# Patient Record
Sex: Female | Born: 1965
Health system: Southern US, Community
[De-identification: ages and names within clinical notes are randomized; demographics above are authoritative.]

## PROBLEM LIST (undated history)

## (undated) DIAGNOSIS — E119 Type 2 diabetes mellitus without complications: Secondary | ICD-10-CM

---

## 1998-02-27 ENCOUNTER — Emergency Department (HOSPITAL_COMMUNITY): Admission: EM | Admit: 1998-02-27 | Discharge: 1998-02-27 | Payer: Self-pay | Admitting: Emergency Medicine

## 1998-10-31 ENCOUNTER — Other Ambulatory Visit: Admission: RE | Admit: 1998-10-31 | Discharge: 1998-10-31 | Payer: Self-pay | Admitting: Obstetrics

## 1999-12-25 ENCOUNTER — Other Ambulatory Visit: Admission: RE | Admit: 1999-12-25 | Discharge: 1999-12-25 | Payer: Self-pay | Admitting: Obstetrics and Gynecology

## 2002-11-02 ENCOUNTER — Other Ambulatory Visit: Admission: RE | Admit: 2002-11-02 | Discharge: 2002-11-02 | Payer: Self-pay | Admitting: Obstetrics and Gynecology

## 2004-03-26 ENCOUNTER — Other Ambulatory Visit: Admission: RE | Admit: 2004-03-26 | Discharge: 2004-03-26 | Payer: Self-pay | Admitting: Obstetrics and Gynecology

## 2005-03-31 ENCOUNTER — Other Ambulatory Visit: Admission: RE | Admit: 2005-03-31 | Discharge: 2005-03-31 | Payer: Self-pay | Admitting: Obstetrics and Gynecology

## 2005-07-21 ENCOUNTER — Ambulatory Visit (HOSPITAL_COMMUNITY): Admission: RE | Admit: 2005-07-21 | Discharge: 2005-07-21 | Payer: Self-pay | Admitting: Obstetrics and Gynecology

## 2005-08-13 ENCOUNTER — Encounter: Admission: RE | Admit: 2005-08-13 | Discharge: 2005-08-13 | Payer: Self-pay | Admitting: Obstetrics and Gynecology

## 2005-09-11 ENCOUNTER — Ambulatory Visit (HOSPITAL_COMMUNITY): Admission: RE | Admit: 2005-09-11 | Discharge: 2005-09-11 | Payer: Self-pay | Admitting: Obstetrics and Gynecology

## 2005-09-15 ENCOUNTER — Ambulatory Visit (HOSPITAL_COMMUNITY): Admission: RE | Admit: 2005-09-15 | Discharge: 2005-09-15 | Payer: Self-pay | Admitting: Obstetrics and Gynecology

## 2009-07-19 ENCOUNTER — Encounter (INDEPENDENT_AMBULATORY_CARE_PROVIDER_SITE_OTHER): Payer: Self-pay | Admitting: *Deleted

## 2009-07-19 ENCOUNTER — Encounter: Admission: RE | Admit: 2009-07-19 | Discharge: 2009-07-19 | Payer: Self-pay | Admitting: Obstetrics and Gynecology

## 2010-09-22 ENCOUNTER — Encounter: Payer: Self-pay | Admitting: Obstetrics and Gynecology

## 2010-10-01 NOTE — Letter (Signed)
Summary: [facility] Cancel and No Reschedule  [facility]  [address] [place], [place] [address]   Phone: [phone]  Fax: [phone]      July 19, 2009 MRN: [id number]   [facility] [address] [place], [place]  [address]     You recently cancelled your endoscopic procedure at the [facility] and did not reschedule for another date.    Your provider recommended this procedure for the benefit of your health.  It is very important that you reschedule it.  Failure to do so may be to the detriment of your health.  Please call us at [phone] and we will be happy to assist you with rescheduling.    If you were referred for this procedure by another physician/provider, we will notify him/her that you did not keep your appointment.   Sincerely,   [facility]

## 2013-06-19 ENCOUNTER — Emergency Department (INDEPENDENT_AMBULATORY_CARE_PROVIDER_SITE_OTHER)
Admission: EM | Admit: 2013-06-19 | Discharge: 2013-06-19 | Disposition: A | Discharge status: Home / Self Care | Payer: Self-pay | Source: Home / Self Care | Attending: Emergency Medicine | Admitting: Emergency Medicine

## 2013-06-19 ENCOUNTER — Encounter (HOSPITAL_COMMUNITY): Payer: Self-pay | Admitting: Emergency Medicine

## 2013-06-19 DIAGNOSIS — N3000 Acute cystitis without hematuria: Secondary | ICD-10-CM

## 2013-06-19 HISTORY — DX: Type 2 diabetes mellitus without complications: E11.9

## 2013-06-19 LAB — POCT URINALYSIS DIP (DEVICE)
Bilirubin Urine: NEGATIVE
Glucose, UA: NEGATIVE mg/dL
Ketones, ur: NEGATIVE mg/dL
Nitrite: POSITIVE — AB
Protein, ur: NEGATIVE mg/dL
Specific Gravity, Urine: 1.01 (ref 1.005–1.030)
Urobilinogen, UA: 0.2 mg/dL (ref 0.0–1.0)
pH: 6 (ref 5.0–8.0)

## 2013-06-19 LAB — POCT PREGNANCY, URINE: Preg Test, Ur: NEGATIVE

## 2013-06-19 MED ORDER — CEPHALEXIN 500 MG PO CAPS: 500.0000 mg | ORAL_CAPSULE | Freq: Three times a day (TID) | ORAL | Status: DC

## 2013-06-19 NOTE — ED Notes (Signed)
C/O foul odor to urine and cloudy urine x 4 days without any dysuria or pain.

## 2013-06-19 NOTE — ED Provider Notes (Signed)
Chief Complaint:   Chief Complaint  Patient presents with  . Urinary Tract Infection    History of Present Illness:   Kelly Miranda is a 47 year old female who has had a five-day history of cloudy, malodorous urine. She denies any dysuria, frequency, urgency, or hematuria. She has no lower, or lower back pain. She denies fever, chills, nausea, or vomiting. She has had no GYN complaints. She had a urinary tract infection the past but this was years ago.  Review of Systems:  Other than noted above, the patient denies any of the following symptoms: General:  No fevers, chills, sweats, aches, or fatigue. GI:  No abdominal pain, back pain, nausea, vomiting, diarrhea, or constipation. GU:  No dysuria, frequency, urgency, hematuria, or incontinence. GYN:  No discharge, itching, vulvar pain or lesions, pelvic pain, or abnormal vaginal bleeding.  PMFSH:  Past medical history, family history, social history, meds, and allergies were reviewed.  She is a diabetic and takes metformin and lisinopril.  Physical Exam:   Vital signs:  BP 118/80  Pulse 76  Temp(Src) 98 F (36.7 C) (Oral)  Resp 16  SpO2 99%  LMP 06/05/2013 Gen:  Alert, oriented, in no distress. Lungs:  Clear to auscultation, no wheezes, rales or rhonchi. Heart:  Regular rhythm, no gallop or murmer. Abdomen:  Flat and soft. There was slight suprapubic pain to palpation.  No guarding, or rebound.  No hepato-splenomegaly or mass.  Bowel sounds were normally active.  No hernia. Back:  No CVA tenderness.  Skin:  Clear, warm and dry.  Labs:    Results for orders placed during the hospital encounter of 06/19/13  POCT URINALYSIS DIP (DEVICE)      Result Value Range   Glucose, UA NEGATIVE  NEGATIVE mg/dL   Bilirubin Urine NEGATIVE  NEGATIVE   Ketones, ur NEGATIVE  NEGATIVE mg/dL   Specific Gravity, Urine 1.010  1.005 - 1.030   Hgb urine dipstick MODERATE (*) NEGATIVE   pH 6.0  5.0 - 8.0   Protein, ur NEGATIVE  NEGATIVE mg/dL   Urobilinogen, UA 0.2  0.0 - 1.0 mg/dL   Nitrite POSITIVE (*) NEGATIVE   Leukocytes, UA TRACE (*) NEGATIVE  POCT PREGNANCY, URINE      Result Value Range   Preg Test, Ur NEGATIVE  NEGATIVE     A urine culture was obtained.  Results are pending at this time and we will call about any positive results.  Assessment: The encounter diagnosis was Acute cystitis.   No evidence of pyelonephritis.  Plan:   1.  Meds:  The following meds were prescribed:   Discharge Medication List as of 06/19/2013 12:37 PM    START taking these medications   Details  cephALEXin (KEFLEX) 500 MG capsule Take 1 capsule (500 mg total) by mouth 3 (three) times daily., Starting 06/19/2013, Until Discontinued, Normal        2.  Patient Education/Counseling:  The patient was given appropriate handouts, self care instructions, and instructed in symptomatic relief. The patient was told to avoid intercourse for 10 days, get extra fluids, and return for a follow up with her primary care doctor at the completion of treatment for a repeat UA and culture.    3.  Follow up:  The patient was told to follow up if no better in 3 to 4 days, if becoming worse in any way, and given some red flag symptoms such as fever, vomiting, or worsening pain which would prompt immediate return.  Follow up here  as necessary.     Reuben Likes, MD 06/19/13 [phone]

## 2013-06-21 LAB — URINE CULTURE
Colony Count: >=100000
Special Requests: NORMAL

## 2013-06-22 NOTE — ED Notes (Signed)
Urine culture: >100,000 colonies E. Coli.  Pt. adequately treated with Keflex. Kelly Miranda 06/22/2013

## 2014-02-24 ENCOUNTER — Emergency Department (HOSPITAL_COMMUNITY)
Admission: EM | Admit: 2014-02-24 | Discharge: 2014-02-24 | Disposition: A | Discharge status: Home / Self Care | Payer: 59 | Attending: Emergency Medicine | Admitting: Emergency Medicine

## 2014-02-24 ENCOUNTER — Encounter (HOSPITAL_COMMUNITY): Payer: Self-pay | Admitting: Emergency Medicine

## 2014-02-24 DIAGNOSIS — Z792 Long term (current) use of antibiotics: Secondary | ICD-10-CM | POA: Insufficient documentation

## 2014-02-24 DIAGNOSIS — Z79899 Other long term (current) drug therapy: Secondary | ICD-10-CM | POA: Insufficient documentation

## 2014-02-24 DIAGNOSIS — L731 Pseudofolliculitis barbae: Secondary | ICD-10-CM

## 2014-02-24 DIAGNOSIS — Z3202 Encounter for pregnancy test, result negative: Secondary | ICD-10-CM | POA: Insufficient documentation

## 2014-02-24 DIAGNOSIS — T887XXA Unspecified adverse effect of drug or medicament, initial encounter: Secondary | ICD-10-CM

## 2014-02-24 DIAGNOSIS — T370X5A Adverse effect of sulfonamides, initial encounter: Secondary | ICD-10-CM | POA: Insufficient documentation

## 2014-02-24 DIAGNOSIS — L738 Other specified follicular disorders: Secondary | ICD-10-CM | POA: Insufficient documentation

## 2014-02-24 DIAGNOSIS — E119 Type 2 diabetes mellitus without complications: Secondary | ICD-10-CM | POA: Insufficient documentation

## 2014-02-24 DIAGNOSIS — R110 Nausea: Secondary | ICD-10-CM | POA: Insufficient documentation

## 2014-02-24 LAB — POC URINE PREG, ED: Preg Test, Ur: NEGATIVE

## 2014-02-24 MED ORDER — DIPHENHYDRAMINE HCL 25 MG PO CAPS
50.0000 mg | ORAL_CAPSULE | Freq: Once | ORAL | Status: AC
  Administered 2014-02-24: 50 mg via ORAL
  Filled 2014-02-24: qty 2
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
## 2014-02-24 NOTE — Discharge Instructions (Signed)
Continue to use Benadryl for any skin itching.  Use warm compresses over your ingrown hairs for 20-30 minutes to help with redness and swelling.  Follow up with a primary care provider for continued evaluation and treatment.    Ingrown Hair An ingrown hair is a hair that curls and re-enters the skin instead of growing straight out of the skin. It happens most often with curly hair. It is usually more severe in the neck area, but it can occur in any shaved area, including the beard area, groin, scalp, and legs. An ingrown hair may cause small pockets of infection. CAUSES  Shaving closely, tweezing, or waxing, especially curly hair. Using hair removal creams can sometimes lead to ingrown hairs, especially in the groin. SYMPTOMS   Small bumps on the skin. The bumps may be filled with pus.  Pain.  Itching. DIAGNOSIS  Your caregiver can usually tell what is wrong by doing a physical exam. TREATMENT  If there is a severe infection, your caregiver may prescribe antibiotic medicines. Laser hair removal may also be done to help prevent regrowth of the hair. HOME CARE INSTRUCTIONS   Do not shave irritated skin. You may start shaving again once the irritation has gone away.  If you are prone to ingrown hairs, consider not shaving as much as possible.  If antibiotics are prescribed, take them as directed. Finish them even if you start to feel better.  You may use a facial sponge in a gentle circular motion to help dislodge ingrown hairs on the face.  You may use a hair removal cream weekly, especially on the legs and underarms. Stop using the cream if it irritates your skin. Use caution when using hair removal creams in the groin area. SHAVING INSTRUCTIONS AFTER TREATMENT  Shower before shaving. Keep areas to be shaved packed in warm, moist wraps for several minutes before shaving. The warm, moist environment helps soften the hairs and makes ingrown hairs less likely to occur.  Use thick  shaving gels.  Use a bump fighter razor that cuts hair slightly above the skin level or use an electric shaver with a longer shave setting.  Shave in the direction of hair growth. Avoid making multiple razor strokes.  Use moisturizing lotions after shaving. Document Released: 11/24/2000 Document Revised: 02/17/2012 Document Reviewed: 11/18/2011 [facility] Patient Information 2015 [place], [place]. This information is not intended to replace advice given to you by your health care provider. Make sure you discuss any questions you have with your health care provider.

## 2014-02-24 NOTE — ED Provider Notes (Signed)
CSN: [id number]     Arrival date & time 02/24/14  0119 History   First MD Initiated Contact with Patient 02/24/14 0358     Chief Complaint  Patient presents with  . Abscess    R thigh  . Nausea   HPI  History provided by the patient. Patient is a 48 year old female with history of diabetes presenting with symptoms of itching to the skin as well as concerns for redness to the right side. The patient reports having small little red bumps to her thigh. She reports squeezing messing with one of the areas to the right side 2 days ago. She has been using over-the-counter topical creams and ointments of varying types over the area. This did result in some increased redness around the area as well as some burning sensation. This has resolved. She does continue to have some general itching of the skin diffusely. She was worried about a bad reaction to Septra which she recently started taking one week ago for an abscess infection with an I&D. Does report this area has improved significantly. No other associated symptoms. No fever, chills or sweats.    Past Medical History  Diagnosis Date  . Diabetes mellitus without complication    History reviewed. No pertinent past surgical history. No family history on file. History  Substance Use Topics  . Smoking status: Never Smoker   . Smokeless tobacco: Not on file  . Alcohol Use: No   OB History   Grav Para Term Preterm Abortions TAB SAB Ect Mult Living                 Review of Systems  Constitutional: Negative for fever, chills and diaphoresis.  All other systems reviewed and are negative.     Allergies  Review of patient's allergies indicates no known allergies.  Home Medications   Prior to Admission medications   Medication Sig Start Date End Date Taking? Authorizing Enora Trillo  acetaminophen (TYLENOL) 500 MG tablet Take 500 mg by mouth every 6 (six) hours as needed for mild pain, fever or headache.    Yes Historical Cranston Koors, MD   lisinopril (PRINIVIL,ZESTRIL) 20 MG tablet Take 20 mg by mouth daily.   Yes Historical Shyler Holzman, MD  metFORMIN (GLUCOPHAGE) 500 MG tablet Take 500 mg by mouth daily.   Yes Historical Tannen Vandezande, MD  sulfamethoxazole-trimethoprim (BACTRIM DS) 800-160 MG per tablet Take 1 tablet by mouth 2 (two) times daily.   Yes Historical Lukisha Procida, MD   BP 93/66  Pulse 95  Temp(Src) 99.1 F (37.3 C) (Oral)  Resp 18  Ht 5\' 10"  (1.778 m)  Wt 290 lb (131.543 kg)  BMI 41.61 kg/m2  SpO2 97%  LMP 01/01/2014 Physical Exam  Nursing note and vitals reviewed. Constitutional: She is oriented to person, place, and time. She appears well-developed and well-nourished. No distress.  HENT:  Head: Normocephalic.  Cardiovascular: Normal rate and regular rhythm.   Pulmonary/Chest: Effort normal and breath sounds normal. No respiratory distress. She has no wheezes. She has no rales.  Abdominal: Soft.  Neurological: She is alert and oriented to person, place, and time.  Skin: Skin is warm and dry. No rash noted.  Patient with a single papular erythematous lesion to the proximal anterior left thigh over the hair follicle. Mild tenderness. No obvious pustule. No surrounding erythema or induration.  Similar type lesion to the medial anterior right thigh with 1 cm circular surrounding erythema. No induration. No bleeding or drainage.  Skin otherwise unremarkable. No other rash.  Psychiatric: She has a normal mood and affect. Her behavior is normal.    ED Course  Procedures  COORDINATION OF CARE:  Nursing notes reviewed. Vital signs reviewed. Initial pt interview and examination performed.   Filed Vitals:   02/24/14 0137  BP: 93/66  Pulse: 95  Temp: 99.1 F (37.3 C)  TempSrc: Oral  Resp: 18  Height: 5\' 10"  (1.778 m)  Weight: 290 lb (131.543 kg)  SpO2: 97%    4:25AM-patient seen and I reviewed. She appears well no acute distress. His appears regular toxic. She is very small ingrown hairs to the upper thighs  bilaterally. There is some increased redness and irritation around one ingrown hair to the medial part of the right side. No significant induration. No signs of large abscess. No significant pain. Patient given Benadryl for her itching symptoms. She never discharged with instructions for warm compresses over the area.   Results for orders placed during the hospital encounter of 02/24/14  POC URINE PREG, ED      Result Value Ref Range   Preg Test, Ur NEGATIVE  NEGATIVE     MDM   Final diagnoses:  Ingrown hair  Non-dose-related adverse reaction to medication, initial encounter     Angus Sellereter S Dammen, PA-C 02/24/14 0533

## 2014-02-26 NOTE — ED Provider Notes (Signed)
Medical screening examination/treatment/procedure(s) were performed by non-physician practitioner and as supervising physician I was immediately available for consultation/collaboration.   Candyce ChurnJohn David Wofford III, MD 02/26/14 [phone]

## 2014-03-23 ENCOUNTER — Other Ambulatory Visit: Payer: Self-pay | Admitting: Physician Assistant

## 2014-03-23 ENCOUNTER — Other Ambulatory Visit (HOSPITAL_COMMUNITY)
Admission: RE | Admit: 2014-03-23 | Discharge: 2014-03-23 | Disposition: A | Discharge status: Home / Self Care | Payer: 59 | Source: Ambulatory Visit | Attending: Family Medicine | Admitting: Family Medicine

## 2014-03-23 DIAGNOSIS — Z124 Encounter for screening for malignant neoplasm of cervix: Secondary | ICD-10-CM | POA: Insufficient documentation

## 2014-03-24 LAB — CYTOLOGY - PAP

## 2015-03-26 ENCOUNTER — Other Ambulatory Visit: Payer: Self-pay | Admitting: Obstetrics and Gynecology

## 2015-03-27 LAB — CYTOLOGY - PAP

## 2015-12-15 DIAGNOSIS — R635 Abnormal weight gain: Secondary | ICD-10-CM | POA: Diagnosis not present

## 2015-12-15 DIAGNOSIS — R5383 Other fatigue: Secondary | ICD-10-CM | POA: Diagnosis not present

## 2015-12-15 DIAGNOSIS — Z79899 Other long term (current) drug therapy: Secondary | ICD-10-CM | POA: Diagnosis not present

## 2015-12-15 DIAGNOSIS — R0602 Shortness of breath: Secondary | ICD-10-CM | POA: Diagnosis not present

## 2015-12-15 DIAGNOSIS — E559 Vitamin D deficiency, unspecified: Secondary | ICD-10-CM | POA: Diagnosis not present

## 2015-12-24 DIAGNOSIS — M170 Bilateral primary osteoarthritis of knee: Secondary | ICD-10-CM | POA: Diagnosis not present

## 2016-01-23 DIAGNOSIS — R635 Abnormal weight gain: Secondary | ICD-10-CM | POA: Diagnosis not present

## 2016-01-23 DIAGNOSIS — Z79899 Other long term (current) drug therapy: Secondary | ICD-10-CM | POA: Diagnosis not present

## 2016-01-23 DIAGNOSIS — E782 Mixed hyperlipidemia: Secondary | ICD-10-CM | POA: Diagnosis not present

## 2016-01-23 DIAGNOSIS — E119 Type 2 diabetes mellitus without complications: Secondary | ICD-10-CM | POA: Diagnosis not present

## 2016-02-15 DIAGNOSIS — E1165 Type 2 diabetes mellitus with hyperglycemia: Secondary | ICD-10-CM | POA: Diagnosis not present

## 2016-03-03 DIAGNOSIS — R635 Abnormal weight gain: Secondary | ICD-10-CM | POA: Diagnosis not present

## 2016-03-03 DIAGNOSIS — Z79899 Other long term (current) drug therapy: Secondary | ICD-10-CM | POA: Diagnosis not present

## 2016-05-31 DIAGNOSIS — R739 Hyperglycemia, unspecified: Secondary | ICD-10-CM | POA: Diagnosis not present

## 2016-05-31 DIAGNOSIS — R635 Abnormal weight gain: Secondary | ICD-10-CM | POA: Diagnosis not present

## 2016-05-31 DIAGNOSIS — E782 Mixed hyperlipidemia: Secondary | ICD-10-CM | POA: Diagnosis not present

## 2016-05-31 DIAGNOSIS — R5383 Other fatigue: Secondary | ICD-10-CM | POA: Diagnosis not present

## 2016-05-31 DIAGNOSIS — E559 Vitamin D deficiency, unspecified: Secondary | ICD-10-CM | POA: Diagnosis not present

## 2016-05-31 DIAGNOSIS — Z79899 Other long term (current) drug therapy: Secondary | ICD-10-CM | POA: Diagnosis not present

## 2016-07-04 DIAGNOSIS — Z713 Dietary counseling and surveillance: Secondary | ICD-10-CM | POA: Diagnosis not present

## 2016-07-07 DIAGNOSIS — Z01419 Encounter for gynecological examination (general) (routine) without abnormal findings: Secondary | ICD-10-CM | POA: Diagnosis not present

## 2016-07-07 DIAGNOSIS — Z1231 Encounter for screening mammogram for malignant neoplasm of breast: Secondary | ICD-10-CM | POA: Diagnosis not present

## 2016-07-07 DIAGNOSIS — Z6841 Body Mass Index (BMI) 40.0 and over, adult: Secondary | ICD-10-CM | POA: Diagnosis not present

## 2016-07-07 DIAGNOSIS — N912 Amenorrhea, unspecified: Secondary | ICD-10-CM | POA: Diagnosis not present

## 2016-07-17 DIAGNOSIS — Z79899 Other long term (current) drug therapy: Secondary | ICD-10-CM | POA: Diagnosis not present

## 2016-07-17 DIAGNOSIS — E119 Type 2 diabetes mellitus without complications: Secondary | ICD-10-CM | POA: Diagnosis not present

## 2016-07-17 DIAGNOSIS — E782 Mixed hyperlipidemia: Secondary | ICD-10-CM | POA: Diagnosis not present

## 2016-07-17 DIAGNOSIS — R635 Abnormal weight gain: Secondary | ICD-10-CM | POA: Diagnosis not present

## 2016-08-09 ENCOUNTER — Emergency Department (HOSPITAL_COMMUNITY): Payer: BLUE CROSS/BLUE SHIELD

## 2016-08-09 ENCOUNTER — Emergency Department (HOSPITAL_COMMUNITY)
Admission: EM | Admit: 2016-08-09 | Discharge: 2016-08-09 | Disposition: A | Discharge status: Home / Self Care | Payer: BLUE CROSS/BLUE SHIELD | Attending: Emergency Medicine | Admitting: Emergency Medicine

## 2016-08-09 ENCOUNTER — Encounter (HOSPITAL_COMMUNITY): Payer: Self-pay

## 2016-08-09 DIAGNOSIS — M25562 Pain in left knee: Secondary | ICD-10-CM | POA: Diagnosis not present

## 2016-08-09 DIAGNOSIS — Y999 Unspecified external cause status: Secondary | ICD-10-CM | POA: Insufficient documentation

## 2016-08-09 DIAGNOSIS — Y92410 Unspecified street and highway as the place of occurrence of the external cause: Secondary | ICD-10-CM | POA: Insufficient documentation

## 2016-08-09 DIAGNOSIS — Z7984 Long term (current) use of oral hypoglycemic drugs: Secondary | ICD-10-CM | POA: Insufficient documentation

## 2016-08-09 DIAGNOSIS — M542 Cervicalgia: Secondary | ICD-10-CM | POA: Diagnosis not present

## 2016-08-09 DIAGNOSIS — S161XXA Strain of muscle, fascia and tendon at neck level, initial encounter: Secondary | ICD-10-CM | POA: Diagnosis not present

## 2016-08-09 DIAGNOSIS — S8992XA Unspecified injury of left lower leg, initial encounter: Secondary | ICD-10-CM | POA: Diagnosis not present

## 2016-08-09 DIAGNOSIS — E119 Type 2 diabetes mellitus without complications: Secondary | ICD-10-CM | POA: Insufficient documentation

## 2016-08-09 DIAGNOSIS — S199XXA Unspecified injury of neck, initial encounter: Secondary | ICD-10-CM | POA: Diagnosis present

## 2016-08-09 DIAGNOSIS — Y939 Activity, unspecified: Secondary | ICD-10-CM | POA: Insufficient documentation

## 2016-08-09 DIAGNOSIS — S8002XA Contusion of left knee, initial encounter: Secondary | ICD-10-CM

## 2016-08-09 MED ORDER — IBUPROFEN 600 MG PO TABS: 600.0000 mg | ORAL_TABLET | Freq: Four times a day (QID) | ORAL | 0 refills | Status: AC | PRN

## 2016-08-09 MED ORDER — HYDROCODONE-ACETAMINOPHEN 5-325 MG PO TABS: 2.0000 | ORAL_TABLET | ORAL | 0 refills | Status: AC | PRN

## 2016-08-09 MED ORDER — CYCLOBENZAPRINE HCL 10 MG PO TABS: 10.0000 mg | ORAL_TABLET | Freq: Two times a day (BID) | ORAL | 0 refills | Status: AC | PRN
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
## 2016-10-15 ENCOUNTER — Other Ambulatory Visit: Payer: Self-pay | Admitting: Physician Assistant

## 2016-10-15 ENCOUNTER — Ambulatory Visit
Admission: RE | Admit: 2016-10-15 | Discharge: 2016-10-15 | Disposition: A | Discharge status: Home / Self Care | Payer: BLUE CROSS/BLUE SHIELD | Source: Ambulatory Visit | Attending: Physician Assistant | Admitting: Physician Assistant

## 2016-10-15 DIAGNOSIS — R202 Paresthesia of skin: Secondary | ICD-10-CM | POA: Diagnosis not present

## 2016-10-15 DIAGNOSIS — M79672 Pain in left foot: Secondary | ICD-10-CM | POA: Diagnosis not present

## 2016-10-27 DIAGNOSIS — E1165 Type 2 diabetes mellitus with hyperglycemia: Secondary | ICD-10-CM | POA: Diagnosis not present

## 2016-10-27 DIAGNOSIS — E7800 Pure hypercholesterolemia, unspecified: Secondary | ICD-10-CM | POA: Diagnosis not present

## 2016-10-27 DIAGNOSIS — Z0000 Encounter for general adult medical examination without abnormal findings: Secondary | ICD-10-CM | POA: Diagnosis not present

## 2016-10-28 ENCOUNTER — Other Ambulatory Visit: Payer: Self-pay | Admitting: Physician Assistant

## 2016-10-28 DIAGNOSIS — E1165 Type 2 diabetes mellitus with hyperglycemia: Secondary | ICD-10-CM

## 2016-10-31 DIAGNOSIS — M722 Plantar fascial fibromatosis: Secondary | ICD-10-CM | POA: Diagnosis not present

## 2016-10-31 DIAGNOSIS — G5601 Carpal tunnel syndrome, right upper limb: Secondary | ICD-10-CM | POA: Diagnosis not present

## 2016-12-06 DIAGNOSIS — R635 Abnormal weight gain: Secondary | ICD-10-CM | POA: Diagnosis not present

## 2016-12-06 DIAGNOSIS — R0602 Shortness of breath: Secondary | ICD-10-CM | POA: Diagnosis not present

## 2016-12-06 DIAGNOSIS — Z79899 Other long term (current) drug therapy: Secondary | ICD-10-CM | POA: Diagnosis not present

## 2016-12-06 DIAGNOSIS — E119 Type 2 diabetes mellitus without complications: Secondary | ICD-10-CM | POA: Diagnosis not present

## 2017-01-07 DIAGNOSIS — M722 Plantar fascial fibromatosis: Secondary | ICD-10-CM | POA: Diagnosis not present

## 2017-01-08 DIAGNOSIS — M25562 Pain in left knee: Secondary | ICD-10-CM | POA: Diagnosis not present

## 2017-01-08 DIAGNOSIS — M25561 Pain in right knee: Secondary | ICD-10-CM | POA: Diagnosis not present

## 2017-01-08 DIAGNOSIS — M722 Plantar fascial fibromatosis: Secondary | ICD-10-CM | POA: Diagnosis not present

## 2017-04-19 DIAGNOSIS — N760 Acute vaginitis: Secondary | ICD-10-CM | POA: Diagnosis not present

## 2017-04-22 DIAGNOSIS — M170 Bilateral primary osteoarthritis of knee: Secondary | ICD-10-CM | POA: Diagnosis not present

## 2017-04-22 DIAGNOSIS — M722 Plantar fascial fibromatosis: Secondary | ICD-10-CM | POA: Diagnosis not present

## 2017-06-16 DIAGNOSIS — E7800 Pure hypercholesterolemia, unspecified: Secondary | ICD-10-CM | POA: Diagnosis not present

## 2017-06-16 DIAGNOSIS — E1165 Type 2 diabetes mellitus with hyperglycemia: Secondary | ICD-10-CM | POA: Diagnosis not present

## 2017-06-16 DIAGNOSIS — Z7984 Long term (current) use of oral hypoglycemic drugs: Secondary | ICD-10-CM | POA: Diagnosis not present

## 2017-06-16 DIAGNOSIS — E119 Type 2 diabetes mellitus without complications: Secondary | ICD-10-CM | POA: Diagnosis not present
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
## 2018-01-19 DIAGNOSIS — E559 Vitamin D deficiency, unspecified: Secondary | ICD-10-CM | POA: Diagnosis not present

## 2018-01-19 DIAGNOSIS — Z79899 Other long term (current) drug therapy: Secondary | ICD-10-CM | POA: Diagnosis not present

## 2018-01-19 DIAGNOSIS — R0602 Shortness of breath: Secondary | ICD-10-CM | POA: Diagnosis not present

## 2018-01-19 DIAGNOSIS — R635 Abnormal weight gain: Secondary | ICD-10-CM | POA: Diagnosis not present

## 2018-01-19 DIAGNOSIS — Z7984 Long term (current) use of oral hypoglycemic drugs: Secondary | ICD-10-CM | POA: Diagnosis not present

## 2018-01-19 DIAGNOSIS — E119 Type 2 diabetes mellitus without complications: Secondary | ICD-10-CM | POA: Diagnosis not present

## 2018-01-19 DIAGNOSIS — E782 Mixed hyperlipidemia: Secondary | ICD-10-CM | POA: Diagnosis not present

## 2018-02-20 DIAGNOSIS — E782 Mixed hyperlipidemia: Secondary | ICD-10-CM | POA: Diagnosis not present

## 2018-02-20 DIAGNOSIS — Z7984 Long term (current) use of oral hypoglycemic drugs: Secondary | ICD-10-CM | POA: Diagnosis not present

## 2018-02-20 DIAGNOSIS — E559 Vitamin D deficiency, unspecified: Secondary | ICD-10-CM | POA: Diagnosis not present

## 2018-02-20 DIAGNOSIS — E119 Type 2 diabetes mellitus without complications: Secondary | ICD-10-CM | POA: Diagnosis not present

## 2018-02-20 DIAGNOSIS — Z79899 Other long term (current) drug therapy: Secondary | ICD-10-CM | POA: Diagnosis not present

## 2018-03-11 DIAGNOSIS — Z0000 Encounter for general adult medical examination without abnormal findings: Secondary | ICD-10-CM | POA: Diagnosis not present

## 2018-03-11 DIAGNOSIS — R21 Rash and other nonspecific skin eruption: Secondary | ICD-10-CM | POA: Diagnosis not present

## 2018-03-11 DIAGNOSIS — E7800 Pure hypercholesterolemia, unspecified: Secondary | ICD-10-CM | POA: Diagnosis not present

## 2018-03-11 DIAGNOSIS — E119 Type 2 diabetes mellitus without complications: Secondary | ICD-10-CM | POA: Diagnosis not present

## 2018-03-26 DIAGNOSIS — E782 Mixed hyperlipidemia: Secondary | ICD-10-CM | POA: Diagnosis not present

## 2018-03-26 DIAGNOSIS — Z7984 Long term (current) use of oral hypoglycemic drugs: Secondary | ICD-10-CM | POA: Diagnosis not present

## 2018-03-26 DIAGNOSIS — E119 Type 2 diabetes mellitus without complications: Secondary | ICD-10-CM | POA: Diagnosis not present

## 2018-03-26 DIAGNOSIS — Z79899 Other long term (current) drug therapy: Secondary | ICD-10-CM | POA: Diagnosis not present

## 2018-03-26 DIAGNOSIS — E559 Vitamin D deficiency, unspecified: Secondary | ICD-10-CM | POA: Diagnosis not present

## 2018-05-07 DIAGNOSIS — E119 Type 2 diabetes mellitus without complications: Secondary | ICD-10-CM | POA: Diagnosis not present

## 2018-05-07 DIAGNOSIS — E782 Mixed hyperlipidemia: Secondary | ICD-10-CM | POA: Diagnosis not present

## 2018-05-07 DIAGNOSIS — Z7984 Long term (current) use of oral hypoglycemic drugs: Secondary | ICD-10-CM | POA: Diagnosis not present

## 2018-05-07 DIAGNOSIS — E559 Vitamin D deficiency, unspecified: Secondary | ICD-10-CM | POA: Diagnosis not present

## 2018-05-07 DIAGNOSIS — Z79899 Other long term (current) drug therapy: Secondary | ICD-10-CM | POA: Diagnosis not present

## 2018-05-27 DIAGNOSIS — H5789 Other specified disorders of eye and adnexa: Secondary | ICD-10-CM | POA: Diagnosis not present

## 2018-06-01 DIAGNOSIS — R002 Palpitations: Secondary | ICD-10-CM | POA: Diagnosis not present

## 2018-06-01 DIAGNOSIS — R609 Edema, unspecified: Secondary | ICD-10-CM | POA: Diagnosis not present

## 2018-06-01 DIAGNOSIS — E1169 Type 2 diabetes mellitus with other specified complication: Secondary | ICD-10-CM | POA: Diagnosis not present

## 2018-06-01 DIAGNOSIS — E7800 Pure hypercholesterolemia, unspecified: Secondary | ICD-10-CM | POA: Diagnosis not present

## 2018-06-03 ENCOUNTER — Telehealth: Payer: Self-pay

## 2018-06-03 NOTE — Telephone Encounter (Signed)
SENT REFERRAL TO SCHEDULING AND FILED NOTES 

## 2018-06-04 ENCOUNTER — Telehealth (HOSPITAL_COMMUNITY): Payer: Self-pay

## 2018-06-04 NOTE — Telephone Encounter (Signed)
Encounter complete. 

## 2018-06-08 ENCOUNTER — Other Ambulatory Visit: Payer: Self-pay

## 2018-06-08 ENCOUNTER — Ambulatory Visit (HOSPITAL_COMMUNITY): Admission: RE | Admit: 2018-06-08 | Payer: Self-pay | Source: Ambulatory Visit

## 2018-06-08 DIAGNOSIS — R002 Palpitations: Secondary | ICD-10-CM

## 2018-06-18 ENCOUNTER — Encounter: Payer: Self-pay | Admitting: Physician Assistant

## 2018-06-19 DIAGNOSIS — R635 Abnormal weight gain: Secondary | ICD-10-CM | POA: Diagnosis not present

## 2018-06-19 DIAGNOSIS — E668 Other obesity: Secondary | ICD-10-CM | POA: Diagnosis not present

## 2018-06-19 DIAGNOSIS — Z79899 Other long term (current) drug therapy: Secondary | ICD-10-CM | POA: Diagnosis not present

## 2018-06-19 DIAGNOSIS — E119 Type 2 diabetes mellitus without complications: Secondary | ICD-10-CM | POA: Diagnosis not present

## 2018-06-30 ENCOUNTER — Telehealth (HOSPITAL_COMMUNITY): Payer: Self-pay

## 2018-06-30 NOTE — Telephone Encounter (Signed)
Encounter complete. 

## 2018-07-02 ENCOUNTER — Ambulatory Visit (HOSPITAL_COMMUNITY)
Admission: RE | Admit: 2018-07-02 | Discharge: 2018-07-02 | Disposition: A | Discharge status: Home / Self Care | Payer: BLUE CROSS/BLUE SHIELD | Source: Ambulatory Visit | Attending: Cardiovascular Disease | Admitting: Cardiovascular Disease

## 2018-07-02 ENCOUNTER — Encounter (HOSPITAL_COMMUNITY): Payer: Self-pay | Admitting: *Deleted

## 2018-07-02 DIAGNOSIS — R002 Palpitations: Secondary | ICD-10-CM | POA: Diagnosis not present

## 2018-07-02 LAB — EXERCISE TOLERANCE TEST
Estimated workload: 7.7 METS
Exercise duration (min): 6 min
Exercise duration (sec): 31 s
MPHR: 170 {beats}/min
Peak HR: 176 {beats}/min
Percent HR: 103 %
RPE: 19
Rest HR: 80 {beats}/min

## 2018-07-02 NOTE — Progress Notes (Unsigned)
Abnormal ETT was reviewed by Dr. Berry. Patient was given the ok to be discharged to go home. 

## 2018-07-30 DIAGNOSIS — R635 Abnormal weight gain: Secondary | ICD-10-CM | POA: Diagnosis not present

## 2018-07-30 DIAGNOSIS — E668 Other obesity: Secondary | ICD-10-CM | POA: Diagnosis not present

## 2018-07-30 DIAGNOSIS — Z79899 Other long term (current) drug therapy: Secondary | ICD-10-CM | POA: Diagnosis not present

## 2018-09-03 DIAGNOSIS — E1169 Type 2 diabetes mellitus with other specified complication: Secondary | ICD-10-CM | POA: Diagnosis not present

## 2018-09-03 DIAGNOSIS — E7800 Pure hypercholesterolemia, unspecified: Secondary | ICD-10-CM | POA: Diagnosis not present

## 2018-09-16 DIAGNOSIS — Z79899 Other long term (current) drug therapy: Secondary | ICD-10-CM | POA: Diagnosis not present

## 2018-09-16 DIAGNOSIS — R635 Abnormal weight gain: Secondary | ICD-10-CM | POA: Diagnosis not present

## 2018-09-16 DIAGNOSIS — E668 Other obesity: Secondary | ICD-10-CM | POA: Diagnosis not present

## 2018-09-20 DIAGNOSIS — Z01419 Encounter for gynecological examination (general) (routine) without abnormal findings: Secondary | ICD-10-CM | POA: Diagnosis not present

## 2018-09-20 DIAGNOSIS — Z6834 Body mass index (BMI) 34.0-34.9, adult: Secondary | ICD-10-CM | POA: Diagnosis not present

## 2018-09-20 DIAGNOSIS — Z1231 Encounter for screening mammogram for malignant neoplasm of breast: Secondary | ICD-10-CM | POA: Diagnosis not present

## 2018-10-18 DIAGNOSIS — Z1211 Encounter for screening for malignant neoplasm of colon: Secondary | ICD-10-CM | POA: Diagnosis not present

## 2018-10-18 DIAGNOSIS — Z01818 Encounter for other preprocedural examination: Secondary | ICD-10-CM | POA: Diagnosis not present

## 2018-11-16 DIAGNOSIS — Z1211 Encounter for screening for malignant neoplasm of colon: Secondary | ICD-10-CM | POA: Diagnosis not present

## 2018-11-16 DIAGNOSIS — Z8371 Family history of colonic polyps: Secondary | ICD-10-CM | POA: Diagnosis not present

## 2018-11-16 DIAGNOSIS — Z800 Family history of malignant neoplasm of digestive organs: Secondary | ICD-10-CM | POA: Diagnosis not present

## 2019-03-14 DIAGNOSIS — H00019 Hordeolum externum unspecified eye, unspecified eyelid: Secondary | ICD-10-CM | POA: Diagnosis not present

## 2019-03-24 DIAGNOSIS — Z79899 Other long term (current) drug therapy: Secondary | ICD-10-CM | POA: Diagnosis not present

## 2019-03-24 DIAGNOSIS — E559 Vitamin D deficiency, unspecified: Secondary | ICD-10-CM | POA: Diagnosis not present

## 2019-03-24 DIAGNOSIS — Z1159 Encounter for screening for other viral diseases: Secondary | ICD-10-CM | POA: Diagnosis not present

## 2019-03-24 DIAGNOSIS — R5383 Other fatigue: Secondary | ICD-10-CM | POA: Diagnosis not present

## 2019-03-24 DIAGNOSIS — R635 Abnormal weight gain: Secondary | ICD-10-CM | POA: Diagnosis not present

## 2019-03-24 DIAGNOSIS — R0602 Shortness of breath: Secondary | ICD-10-CM | POA: Diagnosis not present

## 2019-05-27 DIAGNOSIS — Z79899 Other long term (current) drug therapy: Secondary | ICD-10-CM | POA: Diagnosis not present

## 2019-05-27 DIAGNOSIS — E785 Hyperlipidemia, unspecified: Secondary | ICD-10-CM | POA: Diagnosis not present

## 2019-05-27 DIAGNOSIS — R635 Abnormal weight gain: Secondary | ICD-10-CM | POA: Diagnosis not present

## 2019-05-27 DIAGNOSIS — E119 Type 2 diabetes mellitus without complications: Secondary | ICD-10-CM | POA: Diagnosis not present

## 2019-05-27 DIAGNOSIS — Z6841 Body Mass Index (BMI) 40.0 and over, adult: Secondary | ICD-10-CM | POA: Diagnosis not present

## 2019-05-27 DIAGNOSIS — H00016 Hordeolum externum left eye, unspecified eyelid: Secondary | ICD-10-CM | POA: Diagnosis not present

## 2019-05-27 DIAGNOSIS — Z1159 Encounter for screening for other viral diseases: Secondary | ICD-10-CM | POA: Diagnosis not present

## 2019-06-14 DIAGNOSIS — G4700 Insomnia, unspecified: Secondary | ICD-10-CM | POA: Diagnosis not present

## 2019-06-14 DIAGNOSIS — E785 Hyperlipidemia, unspecified: Secondary | ICD-10-CM | POA: Diagnosis not present

## 2019-06-14 DIAGNOSIS — E119 Type 2 diabetes mellitus without complications: Secondary | ICD-10-CM | POA: Diagnosis not present

## 2019-07-05 DIAGNOSIS — H0015 Chalazion left lower eyelid: Secondary | ICD-10-CM | POA: Diagnosis not present

## 2019-09-26 DIAGNOSIS — Z6841 Body Mass Index (BMI) 40.0 and over, adult: Secondary | ICD-10-CM | POA: Diagnosis not present

## 2019-09-26 DIAGNOSIS — Z1231 Encounter for screening mammogram for malignant neoplasm of breast: Secondary | ICD-10-CM | POA: Diagnosis not present

## 2019-09-26 DIAGNOSIS — Z01419 Encounter for gynecological examination (general) (routine) without abnormal findings: Secondary | ICD-10-CM | POA: Diagnosis not present

## 2019-09-26 DIAGNOSIS — Z13820 Encounter for screening for osteoporosis: Secondary | ICD-10-CM | POA: Diagnosis not present

## 2019-10-19 DIAGNOSIS — E785 Hyperlipidemia, unspecified: Secondary | ICD-10-CM | POA: Diagnosis not present

## 2019-10-19 DIAGNOSIS — E119 Type 2 diabetes mellitus without complications: Secondary | ICD-10-CM | POA: Diagnosis not present

## 2019-10-19 DIAGNOSIS — Z01810 Encounter for preprocedural cardiovascular examination: Secondary | ICD-10-CM | POA: Diagnosis not present

## 2019-10-19 DIAGNOSIS — R635 Abnormal weight gain: Secondary | ICD-10-CM | POA: Diagnosis not present

## 2019-12-31 ENCOUNTER — Ambulatory Visit: Payer: BC Managed Care – PPO | Attending: Internal Medicine

## 2019-12-31 DIAGNOSIS — Z23 Encounter for immunization: Secondary | ICD-10-CM

## 2019-12-31 NOTE — Progress Notes (Signed)
   Covid-19 Vaccination Clinic  Name:  Kelly Miranda    MRN: [id number] DOB: 02-11-1966  12/31/2019  [name] was observed post Covid-19 immunization for 15 minutes without incident. She was provided with Vaccine Information Sheet and instruction to access the V-Safe system.   [name] was instructed to call 911 with any severe reactions post vaccine: Marland Kitchen Difficulty breathing  . Swelling of face and throat  . A fast heartbeat  . A bad rash all over body  . Dizziness and weakness   Immunizations Administered    Name Date Dose VIS Date Route   Pfizer COVID-19 Vaccine 12/31/2019 12:08 PM 0.3 mL 10/26/2018 Intramuscular   Manufacturer: [employer], [employer]   Lot: [id number]   NDC: [address]-3

## 2020-01-21 ENCOUNTER — Ambulatory Visit: Payer: BC Managed Care – PPO | Attending: Internal Medicine

## 2020-01-21 DIAGNOSIS — Z23 Encounter for immunization: Secondary | ICD-10-CM

## 2020-01-21 NOTE — Progress Notes (Signed)
   Covid-19 Vaccination Clinic  Name:  Kelly Miranda    MRN: [id number] DOB: [birth year]  01/21/2020  [name] was observed post Covid-19 immunization for 15 minutes without incident. She was provided with Vaccine Information Sheet and instruction to access the V-Safe system.   [name] was instructed to call 911 with any severe reactions post vaccine: Marland Kitchen Difficulty breathing  . Swelling of face and throat  . A fast heartbeat  . A bad rash all over body  . Dizziness and weakness   Immunizations Administered    Name Date Dose VIS Date Route   Pfizer COVID-19 Vaccine 01/21/2020 11:41 AM 0.3 mL 10/26/2018 Intramuscular   Manufacturer: [employer], [employer]   Lot: [id number]   NDC: [address]-5

## 2020-01-23 ENCOUNTER — Ambulatory Visit: Payer: BC Managed Care – PPO

## 2020-03-13 DIAGNOSIS — E119 Type 2 diabetes mellitus without complications: Secondary | ICD-10-CM | POA: Diagnosis not present

## 2020-03-13 DIAGNOSIS — E785 Hyperlipidemia, unspecified: Secondary | ICD-10-CM | POA: Diagnosis not present

## 2020-03-13 DIAGNOSIS — M7989 Other specified soft tissue disorders: Secondary | ICD-10-CM | POA: Diagnosis not present

## 2020-05-14 DIAGNOSIS — R635 Abnormal weight gain: Secondary | ICD-10-CM | POA: Diagnosis not present

## 2020-05-14 DIAGNOSIS — E119 Type 2 diabetes mellitus without complications: Secondary | ICD-10-CM | POA: Diagnosis not present

## 2020-05-14 DIAGNOSIS — E785 Hyperlipidemia, unspecified: Secondary | ICD-10-CM | POA: Diagnosis not present

## 2020-05-21 DIAGNOSIS — E785 Hyperlipidemia, unspecified: Secondary | ICD-10-CM | POA: Diagnosis not present

## 2020-05-21 DIAGNOSIS — E119 Type 2 diabetes mellitus without complications: Secondary | ICD-10-CM | POA: Diagnosis not present

## 2020-10-30 DIAGNOSIS — Z01419 Encounter for gynecological examination (general) (routine) without abnormal findings: Secondary | ICD-10-CM | POA: Diagnosis not present

## 2020-10-30 DIAGNOSIS — Z6841 Body Mass Index (BMI) 40.0 and over, adult: Secondary | ICD-10-CM | POA: Diagnosis not present

## 2020-10-30 DIAGNOSIS — Z1231 Encounter for screening mammogram for malignant neoplasm of breast: Secondary | ICD-10-CM | POA: Diagnosis not present

## 2020-11-05 ENCOUNTER — Other Ambulatory Visit: Payer: Self-pay | Admitting: Obstetrics and Gynecology

## 2020-11-05 DIAGNOSIS — R928 Other abnormal and inconclusive findings on diagnostic imaging of breast: Secondary | ICD-10-CM

## 2020-11-23 ENCOUNTER — Ambulatory Visit
Admission: RE | Admit: 2020-11-23 | Discharge: 2020-11-23 | Disposition: A | Discharge status: Home / Self Care | Payer: BC Managed Care – PPO | Source: Ambulatory Visit | Attending: Obstetrics and Gynecology | Admitting: Obstetrics and Gynecology

## 2020-11-23 ENCOUNTER — Other Ambulatory Visit: Payer: Self-pay

## 2020-11-23 DIAGNOSIS — R928 Other abnormal and inconclusive findings on diagnostic imaging of breast: Secondary | ICD-10-CM

## 2020-11-23 DIAGNOSIS — N6012 Diffuse cystic mastopathy of left breast: Secondary | ICD-10-CM | POA: Diagnosis not present

## 2020-12-06 DIAGNOSIS — E119 Type 2 diabetes mellitus without complications: Secondary | ICD-10-CM | POA: Diagnosis not present

## 2020-12-06 DIAGNOSIS — E785 Hyperlipidemia, unspecified: Secondary | ICD-10-CM | POA: Diagnosis not present

## 2020-12-13 DIAGNOSIS — K143 Hypertrophy of tongue papillae: Secondary | ICD-10-CM | POA: Diagnosis not present

## 2020-12-13 DIAGNOSIS — E785 Hyperlipidemia, unspecified: Secondary | ICD-10-CM | POA: Diagnosis not present

## 2020-12-13 DIAGNOSIS — Z0000 Encounter for general adult medical examination without abnormal findings: Secondary | ICD-10-CM | POA: Diagnosis not present

## 2020-12-13 DIAGNOSIS — G4700 Insomnia, unspecified: Secondary | ICD-10-CM | POA: Diagnosis not present

## 2020-12-13 DIAGNOSIS — E119 Type 2 diabetes mellitus without complications: Secondary | ICD-10-CM | POA: Diagnosis not present

## 2020-12-26 DIAGNOSIS — Z20822 Contact with and (suspected) exposure to covid-19: Secondary | ICD-10-CM | POA: Diagnosis not present

## 2020-12-27 DIAGNOSIS — N390 Urinary tract infection, site not specified: Secondary | ICD-10-CM | POA: Diagnosis not present

## 2021-01-11 DIAGNOSIS — L659 Nonscarring hair loss, unspecified: Secondary | ICD-10-CM | POA: Diagnosis not present

## 2021-01-11 DIAGNOSIS — R8290 Unspecified abnormal findings in urine: Secondary | ICD-10-CM | POA: Diagnosis not present

## 2021-06-12 DIAGNOSIS — E785 Hyperlipidemia, unspecified: Secondary | ICD-10-CM | POA: Diagnosis not present

## 2021-06-12 DIAGNOSIS — E119 Type 2 diabetes mellitus without complications: Secondary | ICD-10-CM | POA: Diagnosis not present

## 2021-06-12 DIAGNOSIS — Z79899 Other long term (current) drug therapy: Secondary | ICD-10-CM | POA: Diagnosis not present

## 2021-06-19 DIAGNOSIS — E785 Hyperlipidemia, unspecified: Secondary | ICD-10-CM | POA: Diagnosis not present

## 2021-06-19 DIAGNOSIS — E119 Type 2 diabetes mellitus without complications: Secondary | ICD-10-CM | POA: Diagnosis not present

## 2021-06-19 DIAGNOSIS — G4700 Insomnia, unspecified: Secondary | ICD-10-CM | POA: Diagnosis not present

## 2021-06-19 DIAGNOSIS — Z79899 Other long term (current) drug therapy: Secondary | ICD-10-CM | POA: Diagnosis not present

## 2021-10-11 DIAGNOSIS — J069 Acute upper respiratory infection, unspecified: Secondary | ICD-10-CM | POA: Diagnosis not present

## 2021-11-07 DIAGNOSIS — Z01419 Encounter for gynecological examination (general) (routine) without abnormal findings: Secondary | ICD-10-CM | POA: Diagnosis not present

## 2021-11-07 DIAGNOSIS — Z124 Encounter for screening for malignant neoplasm of cervix: Secondary | ICD-10-CM | POA: Diagnosis not present

## 2021-11-07 DIAGNOSIS — Z113 Encounter for screening for infections with a predominantly sexual mode of transmission: Secondary | ICD-10-CM | POA: Diagnosis not present

## 2021-11-07 DIAGNOSIS — N390 Urinary tract infection, site not specified: Secondary | ICD-10-CM | POA: Diagnosis not present

## 2021-11-07 DIAGNOSIS — Z6841 Body Mass Index (BMI) 40.0 and over, adult: Secondary | ICD-10-CM | POA: Diagnosis not present

## 2021-11-07 DIAGNOSIS — R319 Hematuria, unspecified: Secondary | ICD-10-CM | POA: Diagnosis not present

## 2021-11-07 DIAGNOSIS — E8941 Symptomatic postprocedural ovarian failure: Secondary | ICD-10-CM | POA: Diagnosis not present

## 2021-11-25 DIAGNOSIS — Z13820 Encounter for screening for osteoporosis: Secondary | ICD-10-CM | POA: Diagnosis not present

## 2021-11-25 DIAGNOSIS — Z1231 Encounter for screening mammogram for malignant neoplasm of breast: Secondary | ICD-10-CM | POA: Diagnosis not present

## 2021-12-16 DIAGNOSIS — M79641 Pain in right hand: Secondary | ICD-10-CM | POA: Diagnosis not present

## 2021-12-16 DIAGNOSIS — M79642 Pain in left hand: Secondary | ICD-10-CM | POA: Diagnosis not present

## 2021-12-16 DIAGNOSIS — M170 Bilateral primary osteoarthritis of knee: Secondary | ICD-10-CM | POA: Diagnosis not present

## 2021-12-30 DIAGNOSIS — E785 Hyperlipidemia, unspecified: Secondary | ICD-10-CM | POA: Diagnosis not present

## 2021-12-30 DIAGNOSIS — E119 Type 2 diabetes mellitus without complications: Secondary | ICD-10-CM | POA: Diagnosis not present

## 2021-12-30 DIAGNOSIS — Z79899 Other long term (current) drug therapy: Secondary | ICD-10-CM | POA: Diagnosis not present

## 2021-12-31 DIAGNOSIS — G5603 Carpal tunnel syndrome, bilateral upper limbs: Secondary | ICD-10-CM | POA: Diagnosis not present

## 2022-01-03 DIAGNOSIS — Z0000 Encounter for general adult medical examination without abnormal findings: Secondary | ICD-10-CM | POA: Diagnosis not present

## 2022-01-03 DIAGNOSIS — R300 Dysuria: Secondary | ICD-10-CM | POA: Diagnosis not present

## 2022-02-05 DIAGNOSIS — E119 Type 2 diabetes mellitus without complications: Secondary | ICD-10-CM | POA: Diagnosis not present

## 2022-02-05 DIAGNOSIS — Z6841 Body Mass Index (BMI) 40.0 and over, adult: Secondary | ICD-10-CM | POA: Diagnosis not present

## 2022-05-04 ENCOUNTER — Other Ambulatory Visit: Payer: Self-pay

## 2022-05-04 ENCOUNTER — Ambulatory Visit
Admission: EM | Admit: 2022-05-04 | Discharge: 2022-05-04 | Disposition: A | Discharge status: Home / Self Care | Payer: BC Managed Care – PPO | Attending: Physician Assistant | Admitting: Physician Assistant

## 2022-05-04 ENCOUNTER — Encounter: Payer: Self-pay | Admitting: Emergency Medicine

## 2022-05-04 DIAGNOSIS — J029 Acute pharyngitis, unspecified: Secondary | ICD-10-CM

## 2022-05-04 DIAGNOSIS — K148 Other diseases of tongue: Secondary | ICD-10-CM | POA: Diagnosis not present

## 2022-05-04 MED ORDER — AMOXICILLIN 500 MG PO CAPS: 500.0000 mg | ORAL_CAPSULE | Freq: Three times a day (TID) | ORAL | 0 refills | Status: AC

## 2022-05-04 NOTE — ED Triage Notes (Signed)
Pt sts pain on right side of throat and white noted to tongue with pain; pt sts painful area to side of tongue as well

## 2022-05-04 NOTE — ED Provider Notes (Signed)
[facility]    CSN: [id number] Arrival date & time: 05/04/22  0815      History   Chief Complaint Chief Complaint  Patient presents with   Sore Throat    HPI Kelly Miranda is a 56 y.o. female.   Patient here today for evaluation of right sided throat pain, and a white lesions to the right side of her tongue that she first noticed today.  She reports that she woke around 4 AM with some pain in her throat and went to look and saw a white coating to the right side of her tongue.  She also noticed "bumps "on her tongue.  She reports that most of the bumps have resolved except for one to the right side of her tongue that has her worried.  She reports that this area is tender.  She has not had any fever.  She has not tried any treatment for symptoms.  The history is provided by the patient.    Past Medical History:  Diagnosis Date   Diabetes mellitus without complication (HCC)     There are no problems to display for this patient.   History reviewed. No pertinent surgical history.  OB History   No obstetric history on file.      Home Medications    Prior to Admission medications   Medication Sig Start Date End Date Taking? Authorizing Provider  amoxicillin (AMOXIL) 500 MG capsule Take 1 capsule (500 mg total) by mouth 3 (three) times daily. 05/04/22  Yes Tomi Bamberger, PA-C  acetaminophen (TYLENOL) 500 MG tablet Take 500 mg by mouth every 6 (six) hours as needed for mild pain, fever or headache.     [provider]  cyclobenzaprine (FLEXERIL) 10 MG tablet Take 1 tablet (10 mg total) by mouth 2 (two) times daily as needed for muscle spasms. 08/09/16   Nelva Nay, MD  HYDROcodone-acetaminophen (NORCO/VICODIN) 5-325 MG tablet Take 2 tablets by mouth every 4 (four) hours as needed. 08/09/16   Nelva Nay, MD  ibuprofen (ADVIL,MOTRIN) 600 MG tablet Take 1 tablet (600 mg total) by mouth every 6 (six) hours as needed. 08/09/16   Nelva Nay, MD   lisinopril (PRINIVIL,ZESTRIL) 20 MG tablet Take 20 mg by mouth daily.    [provider]  metFORMIN (GLUCOPHAGE) 500 MG tablet Take 500 mg by mouth daily.    [provider]  sulfamethoxazole-trimethoprim (BACTRIM DS) 800-160 MG per tablet Take 1 tablet by mouth 2 (two) times daily.    [provider]    Family History History reviewed. No pertinent family history.  Social History Social History   Tobacco Use   Smoking status: Never   Smokeless tobacco: Never  Substance Use Topics   Alcohol use: No   Drug use: No     Allergies   Patient has no known allergies.   Review of Systems Review of Systems  Constitutional:  Negative for chills and fever.  HENT:  Positive for sore throat. Negative for congestion, ear pain and sinus pressure.   Eyes:  Negative for discharge and redness.  Respiratory:  Negative for cough, shortness of breath and wheezing.   Gastrointestinal:  Negative for abdominal pain, diarrhea, nausea and vomiting.     Physical Exam Triage Vital Signs ED Triage Vitals  Enc Vitals Group     BP      Pulse      Resp      Temp      Temp src  SpO2      Weight      Height      Head Circumference      Peak Flow      Pain Score      Pain Loc      Pain Edu?      Excl. in GC?    No data found.  Updated Vital Signs BP 113/72 (BP Location: Left Arm)   Pulse 80   Temp 98.2 F (36.8 C) (Oral)   Resp 18   SpO2 96%   Physical Exam Vitals and nursing note reviewed.  Constitutional:      General: She is not in acute distress.    Appearance: Normal appearance. She is not ill-appearing.  HENT:     Head: Normocephalic and atraumatic.     Nose: Nose normal. No congestion or rhinorrhea.     Mouth/Throat:     Mouth: Mucous membranes are moist.     Pharynx: No oropharyngeal exudate or posterior oropharyngeal erythema.     Comments: Approx 2 mm white papule noted to right tongue Eyes:     Conjunctiva/sclera: Conjunctivae  normal.  Cardiovascular:     Rate and Rhythm: Normal rate.  Pulmonary:     Effort: Pulmonary effort is normal. No respiratory distress.  Skin:    General: Skin is warm and dry.  Neurological:     Mental Status: She is alert.  Psychiatric:        Mood and Affect: Mood normal.        Thought Content: Thought content normal.      UC Treatments / Results  Labs (all labs ordered are listed, but only abnormal results are displayed) Labs Reviewed - No data to display  EKG   Radiology No results found.  Procedures Procedures (including critical care time)  Medications Ordered in UC Medications - No data to display  Initial Impression / Assessment and Plan / UC Course  I have reviewed the triage vital signs and the nursing notes.  Pertinent labs & imaging results that were available during my care of the patient were reviewed by me and considered in my medical decision making (see chart for details).    I discussed most likely benign nature of symptoms but will treat to cover infection with amoxicillin. Recommended use of antiseptic mouthwash to hopefully help as well.  Encouraged follow up if no improvement or with any further concerns.   Final Clinical Impressions(s) / UC Diagnoses   Final diagnoses:  Acute pharyngitis, unspecified etiology  Tongue lesion   Discharge Instructions   None    ED Prescriptions     Medication Sig Dispense Auth. Provider   amoxicillin (AMOXIL) 500 MG capsule Take 1 capsule (500 mg total) by mouth 3 (three) times daily. 21 capsule Tomi Bamberger, PA-C      PDMP not reviewed this encounter.   Tomi Bamberger, PA-C 05/04/22 1000

## 2022-06-17 DIAGNOSIS — M5489 Other dorsalgia: Secondary | ICD-10-CM | POA: Diagnosis not present

## 2022-06-17 DIAGNOSIS — L309 Dermatitis, unspecified: Secondary | ICD-10-CM | POA: Diagnosis not present

## 2022-09-11 DIAGNOSIS — R7309 Other abnormal glucose: Secondary | ICD-10-CM | POA: Diagnosis not present

## 2022-09-11 DIAGNOSIS — Z79899 Other long term (current) drug therapy: Secondary | ICD-10-CM | POA: Diagnosis not present

## 2022-09-11 DIAGNOSIS — E785 Hyperlipidemia, unspecified: Secondary | ICD-10-CM | POA: Diagnosis not present

## 2022-09-18 DIAGNOSIS — E785 Hyperlipidemia, unspecified: Secondary | ICD-10-CM | POA: Diagnosis not present

## 2022-09-18 DIAGNOSIS — E119 Type 2 diabetes mellitus without complications: Secondary | ICD-10-CM | POA: Diagnosis not present

## 2022-09-18 DIAGNOSIS — E669 Obesity, unspecified: Secondary | ICD-10-CM | POA: Diagnosis not present

## 2022-09-18 DIAGNOSIS — I10 Essential (primary) hypertension: Secondary | ICD-10-CM | POA: Diagnosis not present

## 2022-12-04 DIAGNOSIS — Z6841 Body Mass Index (BMI) 40.0 and over, adult: Secondary | ICD-10-CM | POA: Diagnosis not present

## 2022-12-04 DIAGNOSIS — Z01419 Encounter for gynecological examination (general) (routine) without abnormal findings: Secondary | ICD-10-CM | POA: Diagnosis not present

## 2022-12-04 DIAGNOSIS — Z1231 Encounter for screening mammogram for malignant neoplasm of breast: Secondary | ICD-10-CM | POA: Diagnosis not present

## 2022-12-04 DIAGNOSIS — N760 Acute vaginitis: Secondary | ICD-10-CM | POA: Diagnosis not present

## 2023-02-18 DIAGNOSIS — E119 Type 2 diabetes mellitus without complications: Secondary | ICD-10-CM | POA: Diagnosis not present

## 2023-02-18 DIAGNOSIS — E785 Hyperlipidemia, unspecified: Secondary | ICD-10-CM | POA: Diagnosis not present

## 2023-03-12 DIAGNOSIS — R946 Abnormal results of thyroid function studies: Secondary | ICD-10-CM | POA: Diagnosis not present

## 2023-03-12 DIAGNOSIS — Z79899 Other long term (current) drug therapy: Secondary | ICD-10-CM | POA: Diagnosis not present

## 2023-03-12 DIAGNOSIS — E119 Type 2 diabetes mellitus without complications: Secondary | ICD-10-CM | POA: Diagnosis not present

## 2023-03-12 DIAGNOSIS — I10 Essential (primary) hypertension: Secondary | ICD-10-CM | POA: Diagnosis not present

## 2023-03-19 DIAGNOSIS — G5600 Carpal tunnel syndrome, unspecified upper limb: Secondary | ICD-10-CM | POA: Diagnosis not present

## 2023-03-19 DIAGNOSIS — I10 Essential (primary) hypertension: Secondary | ICD-10-CM | POA: Diagnosis not present

## 2023-03-19 DIAGNOSIS — E119 Type 2 diabetes mellitus without complications: Secondary | ICD-10-CM | POA: Diagnosis not present

## 2023-05-05 DIAGNOSIS — M79642 Pain in left hand: Secondary | ICD-10-CM | POA: Diagnosis not present

## 2023-05-05 DIAGNOSIS — G5601 Carpal tunnel syndrome, right upper limb: Secondary | ICD-10-CM | POA: Diagnosis not present

## 2023-05-05 DIAGNOSIS — M79641 Pain in right hand: Secondary | ICD-10-CM | POA: Diagnosis not present

## 2023-05-07 DIAGNOSIS — Z0000 Encounter for general adult medical examination without abnormal findings: Secondary | ICD-10-CM | POA: Diagnosis not present

## 2023-05-07 DIAGNOSIS — E119 Type 2 diabetes mellitus without complications: Secondary | ICD-10-CM | POA: Diagnosis not present

## 2023-05-07 DIAGNOSIS — G4700 Insomnia, unspecified: Secondary | ICD-10-CM | POA: Diagnosis not present

## 2023-10-01 DIAGNOSIS — R8290 Unspecified abnormal findings in urine: Secondary | ICD-10-CM | POA: Diagnosis not present

## 2023-10-01 DIAGNOSIS — Z113 Encounter for screening for infections with a predominantly sexual mode of transmission: Secondary | ICD-10-CM | POA: Diagnosis not present

## 2023-10-01 DIAGNOSIS — N899 Noninflammatory disorder of vagina, unspecified: Secondary | ICD-10-CM | POA: Diagnosis not present

## 2023-10-01 DIAGNOSIS — N760 Acute vaginitis: Secondary | ICD-10-CM | POA: Diagnosis not present

## 2023-10-01 DIAGNOSIS — N390 Urinary tract infection, site not specified: Secondary | ICD-10-CM | POA: Diagnosis not present

## 2023-11-02 DIAGNOSIS — Z79899 Other long term (current) drug therapy: Secondary | ICD-10-CM | POA: Diagnosis not present

## 2023-11-02 DIAGNOSIS — Z13220 Encounter for screening for lipoid disorders: Secondary | ICD-10-CM | POA: Diagnosis not present

## 2023-11-02 DIAGNOSIS — E119 Type 2 diabetes mellitus without complications: Secondary | ICD-10-CM | POA: Diagnosis not present

## 2023-11-12 DIAGNOSIS — E119 Type 2 diabetes mellitus without complications: Secondary | ICD-10-CM | POA: Diagnosis not present

## 2023-11-12 DIAGNOSIS — G4700 Insomnia, unspecified: Secondary | ICD-10-CM | POA: Diagnosis not present

## 2023-11-12 DIAGNOSIS — G5600 Carpal tunnel syndrome, unspecified upper limb: Secondary | ICD-10-CM | POA: Diagnosis not present

## 2023-12-14 DIAGNOSIS — Z01419 Encounter for gynecological examination (general) (routine) without abnormal findings: Secondary | ICD-10-CM | POA: Diagnosis not present

## 2023-12-14 DIAGNOSIS — Z6841 Body Mass Index (BMI) 40.0 and over, adult: Secondary | ICD-10-CM | POA: Diagnosis not present

## 2023-12-14 DIAGNOSIS — E8941 Symptomatic postprocedural ovarian failure: Secondary | ICD-10-CM | POA: Diagnosis not present

## 2023-12-14 DIAGNOSIS — Z1231 Encounter for screening mammogram for malignant neoplasm of breast: Secondary | ICD-10-CM | POA: Diagnosis not present

## 2023-12-14 DIAGNOSIS — Z13820 Encounter for screening for osteoporosis: Secondary | ICD-10-CM | POA: Diagnosis not present

## 2024-03-17 ENCOUNTER — Other Ambulatory Visit: Payer: Self-pay | Admitting: Family Medicine

## 2024-03-17 ENCOUNTER — Ambulatory Visit: Admit: 2024-03-17 | Source: Home / Self Care

## 2024-03-17 ENCOUNTER — Ambulatory Visit
Admission: RE | Admit: 2024-03-17 | Discharge: 2024-03-17 | Disposition: A | Discharge status: Home / Self Care | Payer: Worker's Compensation | Source: Ambulatory Visit | Attending: Family Medicine | Admitting: Family Medicine

## 2024-03-17 DIAGNOSIS — M79671 Pain in right foot: Secondary | ICD-10-CM

## 2024-04-19 DIAGNOSIS — M7662 Achilles tendinitis, left leg: Secondary | ICD-10-CM | POA: Diagnosis not present

## 2024-04-21 DIAGNOSIS — M7662 Achilles tendinitis, left leg: Secondary | ICD-10-CM | POA: Diagnosis not present

## 2024-05-23 DIAGNOSIS — E119 Type 2 diabetes mellitus without complications: Secondary | ICD-10-CM | POA: Diagnosis not present

## 2024-05-23 DIAGNOSIS — E559 Vitamin D deficiency, unspecified: Secondary | ICD-10-CM | POA: Diagnosis not present

## 2024-05-23 DIAGNOSIS — E785 Hyperlipidemia, unspecified: Secondary | ICD-10-CM | POA: Diagnosis not present

## 2024-05-23 DIAGNOSIS — R946 Abnormal results of thyroid function studies: Secondary | ICD-10-CM | POA: Diagnosis not present

## 2024-05-23 DIAGNOSIS — Z79899 Other long term (current) drug therapy: Secondary | ICD-10-CM | POA: Diagnosis not present

## 2024-06-06 DIAGNOSIS — E785 Hyperlipidemia, unspecified: Secondary | ICD-10-CM | POA: Diagnosis not present

## 2024-06-06 DIAGNOSIS — Z0000 Encounter for general adult medical examination without abnormal findings: Secondary | ICD-10-CM | POA: Diagnosis not present

## 2024-06-06 DIAGNOSIS — N390 Urinary tract infection, site not specified: Secondary | ICD-10-CM | POA: Diagnosis not present

## 2024-06-06 DIAGNOSIS — E119 Type 2 diabetes mellitus without complications: Secondary | ICD-10-CM | POA: Diagnosis not present

## 2024-06-20 DIAGNOSIS — G5601 Carpal tunnel syndrome, right upper limb: Secondary | ICD-10-CM | POA: Diagnosis not present

## 2024-07-31 ENCOUNTER — Ambulatory Visit: Admission: EM | Admit: 2024-07-31 | Discharge: 2024-07-31 | Disposition: A | Discharge status: Home / Self Care

## 2024-07-31 ENCOUNTER — Ambulatory Visit

## 2024-07-31 ENCOUNTER — Encounter: Payer: Self-pay | Admitting: Emergency Medicine

## 2024-07-31 DIAGNOSIS — S62635A Displaced fracture of distal phalanx of left ring finger, initial encounter for closed fracture: Secondary | ICD-10-CM | POA: Diagnosis not present

## 2024-07-31 DIAGNOSIS — S62637A Displaced fracture of distal phalanx of left little finger, initial encounter for closed fracture: Secondary | ICD-10-CM

## 2024-07-31 DIAGNOSIS — M20012 Mallet finger of left finger(s): Secondary | ICD-10-CM | POA: Diagnosis not present

## 2024-07-31 NOTE — Discharge Instructions (Signed)
 Keep finger splinted in extension (finger as straight as possible)  Use ibuprofen  and Tylenol  as needed for pain Call ortho within a week for follow-up

## 2024-07-31 NOTE — ED Provider Notes (Signed)
 [facility]    CSN: [id number] Arrival date & time: 07/31/24  1530      History   Chief Complaint Chief Complaint  Patient presents with  . Finger Injury    HPI Kelly Miranda is a 58 y.o. female.   Patient presents today due to injury to left ring finger that happened 7 days ago.  Patient states that she was getting into and over and she fell onto her left pinky.  Patient states that she has been experiencing pain of the DIP joint of left finger and inability to extend DIP joint of the left finger.  Patient states that she has used ibuprofen  and Tylenol  for a few days but states that at rest she is not experiencing any pain.  The history is provided by the patient.    Past Medical History:  Diagnosis Date  . Diabetes mellitus without complication (HCC)     There are no active problems to display for this patient.   History reviewed. No pertinent surgical history.  OB History   No obstetric history on file.      Home Medications    Prior to Admission medications   Medication Sig Start Date End Date Taking? Authorizing Provider  acetaminophen  (TYLENOL ) 500 MG tablet Take 500 mg by mouth every 6 (six) hours as needed for mild pain, fever or headache.     [provider]  amoxicillin  (AMOXIL ) 500 MG capsule Take 1 capsule (500 mg total) by mouth 3 (three) times daily. 05/04/22   Billy Asberry FALCON, PA-C  cyclobenzaprine  (FLEXERIL ) 10 MG tablet Take 1 tablet (10 mg total) by mouth 2 (two) times daily as needed for muscle spasms. 08/09/16   Lynnea Charleston, MD  HYDROcodone -acetaminophen  (NORCO/VICODIN) 5-325 MG tablet Take 2 tablets by mouth every 4 (four) hours as needed. 08/09/16   Lynnea Charleston, MD  ibuprofen  (ADVIL ,MOTRIN ) 600 MG tablet Take 1 tablet (600 mg total) by mouth every 6 (six) hours as needed. 08/09/16   Lynnea Charleston, MD  lisinopril (PRINIVIL,ZESTRIL) 20 MG tablet Take 20 mg by mouth daily.    [provider]  metFORMIN  (GLUCOPHAGE) 500 MG tablet Take 500 mg by mouth daily.    [provider]  sulfamethoxazole-trimethoprim (BACTRIM DS) 800-160 MG per tablet Take 1 tablet by mouth 2 (two) times daily.    [provider]    Family History History reviewed. No pertinent family history.  Social History Social History   Tobacco Use  . Smoking status: Never    Passive exposure: Never  . Smokeless tobacco: Never  Vaping Use  . Vaping status: Never Used  Substance Use Topics  . Alcohol use: No  . Drug use: No     Allergies   Patient has no known allergies.   Review of Systems Review of Systems   Physical Exam Triage Vital Signs ED Triage Vitals  Encounter Vitals Group     BP 07/31/24 1603 130/87     Girls Systolic BP Percentile --      Girls Diastolic BP Percentile --      Boys Systolic BP Percentile --      Boys Diastolic BP Percentile --      Pulse Rate 07/31/24 1603 80     Resp 07/31/24 1603 18     Temp 07/31/24 1603 99.1 F (37.3 C)     Temp Source 07/31/24 1603 Oral     SpO2 07/31/24 1603 97 %     Weight 07/31/24 1602  285 lb (129.3 kg)     Height --      Head Circumference --      Peak Flow --      Pain Score 07/31/24 1559 8     Pain Loc --      Pain Education --      Exclude from Growth Chart --    No data found.  Updated Vital Signs BP 130/87 (BP Location: Left Arm)   Pulse 80   Temp 99.1 F (37.3 C) (Oral)   Resp 18   Wt 285 lb (129.3 kg)   LMP  (LMP Unknown)   SpO2 97%   Visual Acuity Right Eye Distance:   Left Eye Distance:   Bilateral Distance:    Right Eye Near:   Left Eye Near:    Bilateral Near:     Physical Exam Vitals and nursing note reviewed.  Constitutional:      General: She is not in acute distress.    Appearance: Normal appearance. She is not ill-appearing, toxic-appearing or diaphoretic.  HENT:     Mouth/Throat:     Mouth: Mucous membranes are moist.     Pharynx: Oropharynx is clear.  Eyes:     General: No scleral  icterus. Cardiovascular:     Rate and Rhythm: Normal rate and regular rhythm.     Heart sounds: Normal heart sounds.  Pulmonary:     Effort: Pulmonary effort is normal. No respiratory distress.     Breath sounds: Normal breath sounds. No wheezing or rhonchi.  Skin:    General: Skin is warm.  Neurological:     Mental Status: She is alert and oriented to person, place, and time.  Psychiatric:        Mood and Affect: Mood normal.        Behavior: Behavior normal.      UC Treatments / Results  Labs (all labs ordered are listed, but only abnormal results are displayed) Labs Reviewed - No data to display  EKG   Radiology No results found.  Procedures Procedures (including critical care time)  Medications Ordered in UC Medications - No data to display  Initial Impression / Assessment and Plan / UC Course  I have reviewed the triage vital signs and the nursing notes.  Pertinent labs & imaging results that were available during my care of the patient were reviewed by me and considered in my medical decision making (see chart for details).     *** Final Clinical Impressions(s) / UC Diagnoses   Final diagnoses:  Closed displaced fracture of distal phalanx of left ring finger, initial encounter  Mallet finger of left hand     Discharge Instructions      Keep finger splinted in extension (finger as straight as possible)  Use ibuprofen  and Tylenol  as needed for pain Call ortho within a week for follow-up     ED Prescriptions   None    PDMP not reviewed this encounter.

## 2024-07-31 NOTE — ED Triage Notes (Signed)
 Pt presents c/o finger injury x 7 days. Pt states,  My finger won't lay down. It has been this way since last Saturday. I bent the finger getting in the back of an [place].

## 2024-08-02 ENCOUNTER — Ambulatory Visit (HOSPITAL_COMMUNITY): Payer: Self-pay

## 2024-08-09 DIAGNOSIS — M549 Dorsalgia, unspecified: Secondary | ICD-10-CM | POA: Diagnosis not present

## 2024-08-09 DIAGNOSIS — R059 Cough, unspecified: Secondary | ICD-10-CM | POA: Diagnosis not present

## 2024-08-09 DIAGNOSIS — R0789 Other chest pain: Secondary | ICD-10-CM | POA: Diagnosis not present

## 2024-08-30 DIAGNOSIS — M20012 Mallet finger of left finger(s): Secondary | ICD-10-CM | POA: Diagnosis not present

## 2024-08-30 DIAGNOSIS — S62637A Displaced fracture of distal phalanx of left little finger, initial encounter for closed fracture: Secondary | ICD-10-CM | POA: Diagnosis not present
